# Patient Record
Sex: Male | Born: 1965 | Race: White | Hispanic: No | Marital: Single | Smoking: Never smoker
Health system: Southern US, Community
[De-identification: ages and names within clinical notes are randomized; demographics above are authoritative.]

## PROBLEM LIST (undated history)

## (undated) DIAGNOSIS — E119 Type 2 diabetes mellitus without complications: Secondary | ICD-10-CM

## (undated) DIAGNOSIS — M109 Gout, unspecified: Secondary | ICD-10-CM

## (undated) DIAGNOSIS — E1140 Type 2 diabetes mellitus with diabetic neuropathy, unspecified: Secondary | ICD-10-CM

## (undated) DIAGNOSIS — N289 Disorder of kidney and ureter, unspecified: Secondary | ICD-10-CM

## (undated) DIAGNOSIS — M545 Low back pain, unspecified: Secondary | ICD-10-CM

## (undated) DIAGNOSIS — I10 Essential (primary) hypertension: Secondary | ICD-10-CM

## (undated) DIAGNOSIS — G8929 Other chronic pain: Secondary | ICD-10-CM

## (undated) DIAGNOSIS — N189 Chronic kidney disease, unspecified: Secondary | ICD-10-CM

## (undated) HISTORY — PX: CHOLECYSTECTOMY: SHX55

---

## 2014-11-16 ENCOUNTER — Inpatient Hospital Stay
Admission: EM | Admit: 2014-11-16 | Discharge: 2014-11-18 | DRG: 380 | Disposition: A | Discharge status: Home / Self Care | Payer: No Typology Code available for payment source | Attending: Internal Medicine | Admitting: Internal Medicine

## 2014-11-16 ENCOUNTER — Emergency Department: Payer: No Typology Code available for payment source

## 2014-11-16 ENCOUNTER — Encounter: Payer: Self-pay | Admitting: Emergency Medicine

## 2014-11-16 DIAGNOSIS — I129 Hypertensive chronic kidney disease with stage 1 through stage 4 chronic kidney disease, or unspecified chronic kidney disease: Secondary | ICD-10-CM | POA: Diagnosis present

## 2014-11-16 DIAGNOSIS — E1140 Type 2 diabetes mellitus with diabetic neuropathy, unspecified: Secondary | ICD-10-CM | POA: Diagnosis present

## 2014-11-16 DIAGNOSIS — Z87442 Personal history of urinary calculi: Secondary | ICD-10-CM | POA: Diagnosis not present

## 2014-11-16 DIAGNOSIS — M1A9XX0 Chronic gout, unspecified, without tophus (tophi): Secondary | ICD-10-CM | POA: Diagnosis present

## 2014-11-16 DIAGNOSIS — Z794 Long term (current) use of insulin: Secondary | ICD-10-CM | POA: Diagnosis not present

## 2014-11-16 DIAGNOSIS — Z79899 Other long term (current) drug therapy: Secondary | ICD-10-CM | POA: Diagnosis not present

## 2014-11-16 DIAGNOSIS — Z8249 Family history of ischemic heart disease and other diseases of the circulatory system: Secondary | ICD-10-CM | POA: Diagnosis not present

## 2014-11-16 DIAGNOSIS — K529 Noninfective gastroenteritis and colitis, unspecified: Secondary | ICD-10-CM | POA: Diagnosis present

## 2014-11-16 DIAGNOSIS — G8929 Other chronic pain: Secondary | ICD-10-CM | POA: Diagnosis present

## 2014-11-16 DIAGNOSIS — N183 Chronic kidney disease, stage 3 (moderate): Secondary | ICD-10-CM | POA: Diagnosis present

## 2014-11-16 DIAGNOSIS — Z7982 Long term (current) use of aspirin: Secondary | ICD-10-CM | POA: Diagnosis not present

## 2014-11-16 DIAGNOSIS — N170 Acute kidney failure with tubular necrosis: Secondary | ICD-10-CM | POA: Diagnosis present

## 2014-11-16 DIAGNOSIS — R109 Unspecified abdominal pain: Secondary | ICD-10-CM

## 2014-11-16 DIAGNOSIS — E860 Dehydration: Secondary | ICD-10-CM | POA: Diagnosis present

## 2014-11-16 DIAGNOSIS — R110 Nausea: Secondary | ICD-10-CM

## 2014-11-16 DIAGNOSIS — K255 Chronic or unspecified gastric ulcer with perforation: Secondary | ICD-10-CM | POA: Insufficient documentation

## 2014-11-16 DIAGNOSIS — Z833 Family history of diabetes mellitus: Secondary | ICD-10-CM

## 2014-11-16 DIAGNOSIS — Z7984 Long term (current) use of oral hypoglycemic drugs: Secondary | ICD-10-CM | POA: Diagnosis not present

## 2014-11-16 DIAGNOSIS — Z791 Long term (current) use of non-steroidal anti-inflammatories (NSAID): Secondary | ICD-10-CM

## 2014-11-16 DIAGNOSIS — K251 Acute gastric ulcer with perforation: Secondary | ICD-10-CM | POA: Diagnosis not present

## 2014-11-16 DIAGNOSIS — E1122 Type 2 diabetes mellitus with diabetic chronic kidney disease: Secondary | ICD-10-CM | POA: Diagnosis present

## 2014-11-16 HISTORY — DX: Type 2 diabetes mellitus with diabetic neuropathy, unspecified: E11.40

## 2014-11-16 HISTORY — DX: Chronic kidney disease, unspecified: N18.9

## 2014-11-16 HISTORY — DX: Essential (primary) hypertension: I10

## 2014-11-16 HISTORY — DX: Low back pain: M54.5

## 2014-11-16 HISTORY — DX: Disorder of kidney and ureter, unspecified: N28.9

## 2014-11-16 HISTORY — DX: Other chronic pain: G89.29

## 2014-11-16 HISTORY — DX: Type 2 diabetes mellitus without complications: E11.9

## 2014-11-16 HISTORY — DX: Low back pain, unspecified: M54.50

## 2014-11-16 HISTORY — DX: Gout, unspecified: M10.9

## 2014-11-16 LAB — URINALYSIS COMPLETE WITH MICROSCOPIC (ARMC ONLY)
Bacteria, UA: NONE SEEN
Bilirubin Urine: NEGATIVE
GLUCOSE, UA: 50 mg/dL — AB
Ketones, ur: NEGATIVE mg/dL
Leukocytes, UA: NEGATIVE
Nitrite: NEGATIVE
Protein, ur: 30 mg/dL — AB
SPECIFIC GRAVITY, URINE: 1.017 (ref 1.005–1.030)
SQUAMOUS EPITHELIAL / LPF: NONE SEEN
pH: 5 (ref 5.0–8.0)

## 2014-11-16 LAB — COMPREHENSIVE METABOLIC PANEL
ALBUMIN: 4.4 g/dL (ref 3.5–5.0)
ALK PHOS: 111 U/L (ref 38–126)
ALT: 18 U/L (ref 17–63)
AST: 16 U/L (ref 15–41)
Anion gap: 11 (ref 5–15)
BUN: 66 mg/dL — ABNORMAL HIGH (ref 6–20)
CALCIUM: 9 mg/dL (ref 8.9–10.3)
CO2: 20 mmol/L — AB (ref 22–32)
CREATININE: 2.06 mg/dL — AB (ref 0.61–1.24)
Chloride: 101 mmol/L (ref 101–111)
GFR calc Af Amer: 42 mL/min — ABNORMAL LOW (ref >60)
GFR calc non Af Amer: 36 mL/min — ABNORMAL LOW (ref >60)
GLUCOSE: 314 mg/dL — AB (ref 65–99)
Potassium: 4.1 mmol/L (ref 3.5–5.1)
Sodium: 132 mmol/L — ABNORMAL LOW (ref 135–145)
Total Bilirubin: 0.3 mg/dL (ref 0.3–1.2)
Total Protein: 8.4 g/dL — ABNORMAL HIGH (ref 6.5–8.1)

## 2014-11-16 LAB — CBC
HCT: 52.3 % — ABNORMAL HIGH (ref 40.0–52.0)
HEMOGLOBIN: 17 g/dL (ref 13.0–18.0)
MCH: 29 pg (ref 26.0–34.0)
MCHC: 32.4 g/dL (ref 32.0–36.0)
MCV: 89.3 fL (ref 80.0–100.0)
PLATELETS: 221 10*3/uL (ref 150–440)
RBC: 5.86 MIL/uL (ref 4.40–5.90)
RDW: 14.3 % (ref 11.5–14.5)
WBC: 20 10*3/uL — ABNORMAL HIGH (ref 3.8–10.6)

## 2014-11-16 LAB — C DIFFICILE QUICK SCREEN W PCR REFLEX
C Diff antigen: NEGATIVE
C Diff interpretation: NEGATIVE
C Diff toxin: NEGATIVE

## 2014-11-16 LAB — DIFFERENTIAL
Basophils Absolute: 0.1 10*3/uL (ref 0–0.1)
Basophils Relative: 1 %
EOS ABS: 0.2 10*3/uL (ref 0–0.7)
EOS PCT: 1 %
Lymphocytes Relative: 13 %
Lymphs Abs: 2.6 10*3/uL (ref 1.0–3.6)
Monocytes Absolute: 1.5 10*3/uL — ABNORMAL HIGH (ref 0.2–1.0)
Monocytes Relative: 8 %
NEUTROS PCT: 77 %
Neutro Abs: 15.6 10*3/uL — ABNORMAL HIGH (ref 1.4–6.5)

## 2014-11-16 LAB — LIPASE, BLOOD: Lipase: 59 U/L — ABNORMAL HIGH (ref 11–51)

## 2014-11-16 LAB — GLUCOSE, CAPILLARY: Glucose-Capillary: 195 mg/dL — ABNORMAL HIGH (ref 65–99)

## 2014-11-16 MED ORDER — TRAMADOL HCL 50 MG PO TABS: 50.0000 mg | ORAL_TABLET | Freq: Every evening | ORAL | Status: DC | PRN

## 2014-11-16 MED ORDER — INSULIN ASPART PROT & ASPART (70-30 MIX) 100 UNIT/ML ~~LOC~~ SUSP
40.0000 [IU] | Freq: Two times a day (BID) | SUBCUTANEOUS | Status: DC
  Administered 2014-11-17 – 2014-11-18 (×3): 40 [IU] via SUBCUTANEOUS
  Filled 2014-11-16 (×3): qty 40

## 2014-11-16 MED ORDER — SODIUM CHLORIDE 0.9 % IV SOLN
1000.0000 mL | Freq: Once | INTRAVENOUS | Status: AC
  Administered 2014-11-16: 1000 mL via INTRAVENOUS

## 2014-11-16 MED ORDER — PANTOPRAZOLE SODIUM 40 MG IV SOLR
40.0000 mg | Freq: Two times a day (BID) | INTRAVENOUS | Status: DC
  Administered 2014-11-16 – 2014-11-18 (×4): 40 mg via INTRAVENOUS
  Filled 2014-11-16 (×4): qty 40

## 2014-11-16 MED ORDER — PIPERACILLIN-TAZOBACTAM 3.375 G IVPB
3.3750 g | Freq: Once | INTRAVENOUS | Status: AC
  Administered 2014-11-16: 3.375 g via INTRAVENOUS
  Filled 2014-11-16: qty 50

## 2014-11-16 MED ORDER — ONDANSETRON HCL 4 MG/2ML IJ SOLN
4.0000 mg | Freq: Four times a day (QID) | INTRAMUSCULAR | Status: DC | PRN
  Administered 2014-11-16: 4 mg via INTRAVENOUS
  Filled 2014-11-16: qty 2

## 2014-11-16 MED ORDER — VITAMIN B-12 1000 MCG PO TABS
2500.0000 ug | ORAL_TABLET | Freq: Every day | ORAL | Status: DC
  Administered 2014-11-17 – 2014-11-18 (×2): 2500 ug via ORAL
  Filled 2014-11-16 (×2): qty 3

## 2014-11-16 MED ORDER — ONDANSETRON HCL 4 MG PO TABS: 4.0000 mg | ORAL_TABLET | Freq: Four times a day (QID) | ORAL | Status: DC | PRN

## 2014-11-16 MED ORDER — SODIUM CHLORIDE 0.9 % IV SOLN
INTRAVENOUS | Status: DC
  Administered 2014-11-16 – 2014-11-17 (×3): via INTRAVENOUS

## 2014-11-16 MED ORDER — ALLOPURINOL 300 MG PO TABS
300.0000 mg | ORAL_TABLET | Freq: Every day | ORAL | Status: DC
  Administered 2014-11-16 – 2014-11-18 (×3): 300 mg via ORAL
  Filled 2014-11-16 (×3): qty 1

## 2014-11-16 MED ORDER — SODIUM CHLORIDE 0.9 % IJ SOLN
3.0000 mL | Freq: Two times a day (BID) | INTRAMUSCULAR | Status: DC
  Administered 2014-11-17: 3 mL via INTRAVENOUS

## 2014-11-16 MED ORDER — HYDROXYCHLOROQUINE SULFATE 200 MG PO TABS
400.0000 mg | ORAL_TABLET | Freq: Every day | ORAL | Status: DC
  Administered 2014-11-17 – 2014-11-18 (×2): 400 mg via ORAL
  Filled 2014-11-16 (×2): qty 2

## 2014-11-16 MED ORDER — SIMVASTATIN 20 MG PO TABS
20.0000 mg | ORAL_TABLET | Freq: Every day | ORAL | Status: DC
  Administered 2014-11-16 – 2014-11-17 (×2): 20 mg via ORAL
  Filled 2014-11-16 (×2): qty 1

## 2014-11-16 MED ORDER — NIACIN 500 MG PO TABS
250.0000 mg | ORAL_TABLET | Freq: Every day | ORAL | Status: DC
  Administered 2014-11-17 – 2014-11-18 (×2): 250 mg via ORAL
  Filled 2014-11-16 (×3): qty 1

## 2014-11-16 MED ORDER — FAMOTIDINE 20 MG PO TABS
20.0000 mg | ORAL_TABLET | Freq: Every day | ORAL | Status: DC
  Administered 2014-11-17 – 2014-11-18 (×2): 20 mg via ORAL
  Filled 2014-11-16 (×2): qty 1

## 2014-11-16 MED ORDER — ACETAMINOPHEN 650 MG RE SUPP
650.0000 mg | Freq: Four times a day (QID) | RECTAL | Status: DC | PRN
Start: 2014-11-16 — End: 2014-11-18

## 2014-11-16 MED ORDER — COLCHICINE 0.6 MG PO TABS: 0.6000 mg | ORAL_TABLET | Freq: Every day | ORAL | Status: DC | PRN

## 2014-11-16 MED ORDER — CITALOPRAM HYDROBROMIDE 20 MG PO TABS
40.0000 mg | ORAL_TABLET | Freq: Every day | ORAL | Status: DC
  Administered 2014-11-17 – 2014-11-18 (×2): 40 mg via ORAL
  Filled 2014-11-16 (×2): qty 2

## 2014-11-16 MED ORDER — MUPIROCIN 2 % EX OINT
1.0000 "application " | TOPICAL_OINTMENT | Freq: Every day | CUTANEOUS | Status: DC | PRN
  Filled 2014-11-16: qty 22

## 2014-11-16 MED ORDER — CHOLECALCIFEROL 10 MCG (400 UNIT) PO TABS
800.0000 [IU] | ORAL_TABLET | Freq: Every day | ORAL | Status: DC
  Administered 2014-11-17 – 2014-11-18 (×2): 800 [IU] via ORAL
  Filled 2014-11-16 (×2): qty 2

## 2014-11-16 MED ORDER — PIPERACILLIN-TAZOBACTAM 4.5 G IVPB
4.5000 g | Freq: Three times a day (TID) | INTRAVENOUS | Status: DC
  Administered 2014-11-17 – 2014-11-18 (×4): 4.5 g via INTRAVENOUS
  Filled 2014-11-16 (×6): qty 100

## 2014-11-16 MED ORDER — AMLODIPINE BESYLATE 10 MG PO TABS
10.0000 mg | ORAL_TABLET | Freq: Every day | ORAL | Status: DC
  Administered 2014-11-17 – 2014-11-18 (×2): 10 mg via ORAL
  Filled 2014-11-16 (×2): qty 1

## 2014-11-16 MED ORDER — CLONAZEPAM 1 MG PO TABS
1.0000 mg | ORAL_TABLET | Freq: Two times a day (BID) | ORAL | Status: DC | PRN
  Administered 2014-11-17: 1 mg via ORAL

## 2014-11-16 MED ORDER — ACETAMINOPHEN 325 MG PO TABS: 650.0000 mg | ORAL_TABLET | Freq: Four times a day (QID) | ORAL | Status: DC | PRN

## 2014-11-16 MED ORDER — ONDANSETRON HCL 4 MG/2ML IJ SOLN
4.0000 mg | Freq: Once | INTRAMUSCULAR | Status: AC
  Administered 2014-11-16: 4 mg via INTRAVENOUS
  Filled 2014-11-16: qty 2

## 2014-11-16 MED ORDER — FERROUS SULFATE 325 (65 FE) MG PO TABS
325.0000 mg | ORAL_TABLET | Freq: Every day | ORAL | Status: DC
  Administered 2014-11-17 – 2014-11-18 (×2): 325 mg via ORAL
  Filled 2014-11-16 (×2): qty 1

## 2014-11-16 MED ORDER — INSULIN ASPART 100 UNIT/ML ~~LOC~~ SOLN: 0.0000 [IU] | Freq: Every day | SUBCUTANEOUS | Status: DC

## 2014-11-16 MED ORDER — CLONAZEPAM 1 MG PO TABS
2.0000 mg | ORAL_TABLET | Freq: Every day | ORAL | Status: DC
  Administered 2014-11-16: 2 mg via ORAL
  Filled 2014-11-16 (×2): qty 2

## 2014-11-16 MED ORDER — INSULIN ASPART 100 UNIT/ML ~~LOC~~ SOLN
0.0000 [IU] | Freq: Three times a day (TID) | SUBCUTANEOUS | Status: DC
  Administered 2014-11-17 (×2): 2 [IU] via SUBCUTANEOUS
  Administered 2014-11-17 – 2014-11-18 (×2): 3 [IU] via SUBCUTANEOUS
  Filled 2014-11-16 (×2): qty 3
  Filled 2014-11-16 (×2): qty 2

## 2014-11-16 NOTE — Progress Notes (Signed)
ANTIBIOTIC CONSULT NOTE - INITIAL  Pharmacy Consult for Zosyn  Indication: intra-abdominal  No Known Allergies  Patient Measurements: Height: 6\' 1"  (185.4 cm) Weight: 275 lb (124.739 kg) IBW/kg (Calculated) : 79.9 Adjusted Body Weight:   Vital Signs: Temp: 98.5 F (36.9 C) (11/11 1745) Temp Source: Oral (11/11 1745) BP: 131/85 mmHg (11/11 1745) Pulse Rate: 102 (11/11 1745) Intake/Output from previous day:   Intake/Output from this shift:    Labs:  Recent Labs  11/16/14 1010  WBC 20.0*  HGB 17.0  PLT 221  CREATININE 2.06*   Estimated Creatinine Clearance: 59.3 mL/min (by C-G formula based on Cr of 2.06). No results for input(s): VANCOTROUGH, VANCOPEAK, VANCORANDOM, GENTTROUGH, GENTPEAK, GENTRANDOM, TOBRATROUGH, TOBRAPEAK, TOBRARND, AMIKACINPEAK, AMIKACINTROU, AMIKACIN in the last 72 hours.   Microbiology: No results found for this or any previous visit (from the past 720 hour(s)).  Medical History: Past Medical History  Diagnosis Date  . Renal disorder     kidney stones  . Diabetes mellitus without complication (HCC)   . Hypertension   . Gout   . Chronic lower back pain   . CKD (chronic kidney disease)   . Diabetic neuropathy (HCC)     Medications:  Prescriptions prior to admission  Medication Sig Dispense Refill Last Dose  . allopurinol (ZYLOPRIM) 300 MG tablet Take 300-600 mg by mouth See admin instructions. 2 tablets every morning and 1 tablet at bedtime   11/15/2014 at pm  . amLODipine (NORVASC) 10 MG tablet Take 10 mg by mouth daily.   11/15/2014 at am  . aspirin 325 MG tablet Take 325 mg by mouth daily.   11/15/2014 at am  . citalopram (CELEXA) 40 MG tablet Take 40 mg by mouth daily.   11/15/2014 at am  . clonazePAM (KLONOPIN) 2 MG tablet Take 2 mg by mouth at bedtime.   11/15/2014 at pm  . clonazePAM (KLONOPIN) 2 MG tablet Take 1 mg by mouth daily as needed for anxiety.   PRN  . colchicine 0.6 MG tablet Take 0.6 mg by mouth daily as needed (for gout  flare-ups).   PRN  . Cyanocobalamin (HM SUPER VITAMIN B12) 2500 MCG CHEW Chew 2,500 mcg by mouth daily.   11/15/2014 at am  . ferrous sulfate 325 (65 FE) MG tablet Take 325 mg by mouth daily.   11/15/2014 at am  . hydroxychloroquine (PLAQUENIL) 200 MG tablet Take 400 mg by mouth daily.   11/15/2014 at am  . indomethacin (INDOCIN) 50 MG capsule Take 50 mg by mouth daily.   11/15/2014 at am  . insulin lispro (HUMALOG) 100 UNIT/ML injection Inject into the skin 3 (three) times daily as needed for high blood sugar (per sliding scale).   PRN  . insulin lispro protamine-lispro (HUMALOG 75/25 MIX) (75-25) 100 UNIT/ML SUSP injection Inject 60-80 Units into the skin See admin instructions. 80 units every morning and 60 units at bedtime   11/15/2014 at pm  . lisinopril (PRINIVIL,ZESTRIL) 40 MG tablet Take 40 mg by mouth daily.   11/15/2014 at am  . metFORMIN (GLUCOPHAGE) 500 MG tablet Take 500 mg by mouth 2 (two) times daily with a meal.   11/15/2014 at pm  . mupirocin ointment (BACTROBAN) 2 % Place 1 application into the nose daily as needed (for MRSA flare-ups).   PRN  . niacin 250 MG tablet Take 250 mg by mouth daily.   11/15/2014 at am  . ranitidine (ZANTAC) 150 MG tablet Take 150 mg by mouth daily.   11/15/2014 at  am  . simvastatin (ZOCOR) 20 MG tablet Take 20 mg by mouth at bedtime.   11/15/2014 at pm  . traMADol (ULTRAM) 50 MG tablet Take 50-100 mg by mouth at bedtime as needed for moderate pain or severe pain.   PRN  . Vitamin D, Cholecalciferol, 400 UNITS CHEW Chew 800 Units by mouth daily.   11/15/2014 at am   Assessment: CrCl = 59.3 ml/min, TBW = 124.7 kg  Goal of Therapy:  resolution of infection  Plan:  Expected duration 7 days with resolution of temperature and/or normalization of WBC   Zosyn 3.375 gm IV X 1 given on 11/11 @ 16:00. Zosyn 4.5 gm IV Q8H EI ordered to start 11/12 @ 2:00.   Mario Lewis D 11/16/2014,8:28 PM

## 2014-11-16 NOTE — Consult Note (Signed)
General Surgery Consult Note  CC: Nausea/vomiting/diarrhea x 1 week, leukocytosis, CT scan with small perigastric pneumoperitoneum pockets  HPI: [name] is a pleasant 49 yo with a history of DM and gout who presents with 1 week of nausea/vomiting/diarrhea.  Began acutely.  Has not been resolving.  + chills.  Was doing well before this.  No abdominal pain then or currently.  Takes indomethacin for gout.  H/o EGD and colonoscopy for anemia in past which were unremarkable.  No fevers, chest pain, shortness of breath, cough, dysuria/hematuria.  Baseline cr per his report is around 2, bg usually in 200s but increases following perispinal steroid ingestion, which he has gotten recently.   Active Ambulatory Problems    Diagnosis Date Noted  . No Active Ambulatory Problems   Resolved Ambulatory Problems    Diagnosis Date Noted  . No Resolved Ambulatory Problems   Past Medical History  Diagnosis Date  . Renal disorder   . Diabetes mellitus without complication (HCC)   . Hypertension   . Gout   . Chronic lower back pain   . CKD (chronic kidney disease)   . Diabetic neuropathy [facility](HCC)    Past Surgical History  Procedure Laterality Date  . Cholecystectomy       Medication List    ASK your doctor about these medications        allopurinol 300 MG tablet  Commonly known as:  ZYLOPRIM  Take 300-600 mg by mouth See admin instructions. 2 tablets every morning and 1 tablet at bedtime     amLODipine 10 MG tablet  Commonly known as:  NORVASC  Take 10 mg by mouth daily.     aspirin 325 MG tablet  Take 325 mg by mouth daily.     citalopram 40 MG tablet  Commonly known as:  CELEXA  Take 40 mg by mouth daily.     clonazePAM 2 MG tablet  Commonly known as:  KLONOPIN  Take 2 mg by mouth at bedtime.     clonazePAM 2 MG tablet  Commonly known as:  KLONOPIN  Take 1 mg by mouth daily as needed for anxiety.     colchicine 0.6 MG tablet  Take 0.6 mg by mouth daily as needed (for gout  flare-ups).     ferrous sulfate 325 (65 FE) MG tablet  Take 325 mg by mouth daily.     HM SUPER VITAMIN B12 2500 MCG Chew  Generic drug:  Cyanocobalamin  Chew 2,500 mcg by mouth daily.     hydroxychloroquine 200 MG tablet  Commonly known as:  PLAQUENIL  Take 400 mg by mouth daily.     indomethacin 50 MG capsule  Commonly known as:  INDOCIN  Take 50 mg by mouth daily.     insulin lispro 100 UNIT/ML injection  Commonly known as:  HUMALOG  Inject into the skin 3 (three) times daily as needed for high blood sugar (per sliding scale).     insulin lispro protamine-lispro (75-25) 100 UNIT/ML Susp injection  Commonly known as:  HUMALOG 75/25 MIX  Inject 60-80 Units into the skin See admin instructions. 80 units every morning and 60 units at bedtime     lisinopril 40 MG tablet  Commonly known as:  PRINIVIL,ZESTRIL  Take 40 mg by mouth daily.     metFORMIN 500 MG tablet  Commonly known as:  GLUCOPHAGE  Take 500 mg by mouth 2 (two) times daily with a meal.     mupirocin ointment 2 %  Commonly known as:  BACTROBAN  Place 1 application into the nose daily as needed (for MRSA flare-ups).     niacin 250 MG tablet  Take 250 mg by mouth daily.     ranitidine 150 MG tablet  Commonly known as:  ZANTAC  Take 150 mg by mouth daily.     simvastatin 20 MG tablet  Commonly known as:  ZOCOR  Take 20 mg by mouth at bedtime.     traMADol 50 MG tablet  Commonly known as:  ULTRAM  Take 50-100 mg by mouth at bedtime as needed for moderate pain or severe pain.     Vitamin D (Cholecalciferol) 400 UNITS Chew  Chew 800 Units by mouth daily.      No Known Allergies   Social History   Social History  . Marital Status: Single    Spouse Name: N/A  . Number of Children: N/A  . Years of Education: N/A   Occupational History  . Not on file.   Social History Main Topics  . Smoking status: Never Smoker   . Smokeless tobacco: Not on file  . Alcohol Use: No  . Drug Use: No  . Sexual  Activity: Not on file   Other Topics Concern  . Not on file   Social History Narrative   Lives at home with his wife   Family History  Problem Relation Age of Onset  . CAD Father   . CAD Mother   . Diabetes Mellitus II Father    Blood pressure 116/72, pulse 110, temperature 98.5 F (36.9 C), temperature source Oral, height  (1.854 m), weight 275 lb (124.739 kg), SpO2 96 %. GEN: NAD/A&Ox3 FACE: no obvious facial trauma, normal external nose, normal external ears EYES: no scleral icterus, no conjunctivitis HEAD: normocephalic atraumatic CV: RRR, no MRG RESP: moving air well, lungs clear ABD: soft, nontender, nondistended BACK: No erythema/induration/fluctaunce,  nontender EXT: moving all ext well, strength 5/5 NEURO: cnII-XII grossly intact, sensation intact all 4 ext  Labs: Personally reviewed, significant for: -WBC 20 BUN 66 BG 314  Imaging: Personally reviewed, significant for  - tiny foci of intraluminal air around stomach, stomach otherwise unremarkable, no intraabdominal inflammation associated  A/P 49 yo M with 1 week of n/v/d and incidental finding of small foci of intraluminal air.  Etiology uncertain, would suspect ulcer perf, particularly in context of prior chronic NSAID use but has had no prior or current pain whatsoever.  Tachycardia and leukocytosis significant but BUN elevated as well so may in part be improved with dehydration.  Would recommend treating as perf with abx and GI input.

## 2014-11-16 NOTE — ED Notes (Signed)
Pt reports nausea, reports "kidney pain", reports lower back pain and lower abdominal pain. Pt reports hx of kidney stones.

## 2014-11-16 NOTE — ED Provider Notes (Signed)
[facility] Emergency Department Provider Note  ____________________________________________  Time seen: On arrival  I have reviewed the triage vital signs and the nursing notes.   HISTORY  Chief Complaint Nausea; Abdominal Pain; and Back Pain    HPI Mario Lewis is a 49 y.o. male who presents with complaints of nausea vomiting diarrhea, queasiness for the last several days. He is most concerned about his nausea and a sense of feeling queasy. He has diffuse abdominal discomfort and some pain in his flanks bilaterally which she believes demonstrates that he is having kidney stones. He denies blood in his urine. No dysuria. No fevers or chills. He has had 2 episodes of loose stools     Past Medical History  Diagnosis Date  . Renal disorder     kidney stones  . Diabetes mellitus without complication (HCC)   . Hypertension   . Gout   . Chronic lower back pain     There are no active problems to display for this patient.   Past Surgical History  Procedure Laterality Date  . Cholecystectomy      Current Outpatient Rx  Name  Route  Sig  Dispense  Refill  . allopurinol (ZYLOPRIM) 300 MG tablet   Oral   Take 300-600 mg by mouth See admin instructions. 2 tablets every morning and 1 tablet at bedtime         . amLODipine (NORVASC) 10 MG tablet   Oral   Take 10 mg by mouth daily.         Marland Kitchen aspirin 325 MG tablet   Oral   Take 325 mg by mouth daily.         . citalopram (CELEXA) 40 MG tablet   Oral   Take 40 mg by mouth daily.         . clonazePAM (KLONOPIN) 2 MG tablet   Oral   Take 2 mg by mouth at bedtime.         . clonazePAM (KLONOPIN) 2 MG tablet   Oral   Take 1 mg by mouth daily as needed for anxiety.         . colchicine 0.6 MG tablet   Oral   Take 0.6 mg by mouth daily as needed (for gout flare-ups).         . Cyanocobalamin (HM SUPER VITAMIN B12) 2500 MCG CHEW   Oral   Chew 2,500 mcg by mouth daily.          . ferrous sulfate 325 (65 FE) MG tablet   Oral   Take 325 mg by mouth daily.         . hydroxychloroquine (PLAQUENIL) 200 MG tablet   Oral   Take 400 mg by mouth daily.         . indomethacin (INDOCIN) 50 MG capsule   Oral   Take 50 mg by mouth daily.         . insulin lispro (HUMALOG) 100 UNIT/ML injection   Subcutaneous   Inject into the skin 3 (three) times daily as needed for high blood sugar (per sliding scale).         . insulin lispro protamine-lispro (HUMALOG 75/25 MIX) (75-25) 100 UNIT/ML SUSP injection   Subcutaneous   Inject 60-80 Units into the skin See admin instructions. 80 units every morning and 60 units at bedtime         . lisinopril (PRINIVIL,ZESTRIL) 40 MG tablet   Oral   Take 40 mg  by mouth daily.         . metFORMIN (GLUCOPHAGE) 500 MG tablet   Oral   Take 500 mg by mouth 2 (two) times daily with a meal.         . mupirocin ointment (BACTROBAN) 2 %   Nasal   Place 1 application into the nose daily as needed (for MRSA flare-ups).         . niacin 250 MG tablet   Oral   Take 250 mg by mouth daily.         . ranitidine (ZANTAC) 150 MG tablet   Oral   Take 150 mg by mouth daily.         . simvastatin (ZOCOR) 20 MG tablet   Oral   Take 20 mg by mouth at bedtime.         . traMADol (ULTRAM) 50 MG tablet   Oral   Take 50-100 mg by mouth at bedtime as needed for moderate pain or severe pain.         . Vitamin D, Cholecalciferol, 400 UNITS CHEW   Oral   Chew 800 Units by mouth daily.           Allergies Review of patient's allergies indicates no known allergies.  No family history on file.  Social History Social History  Substance Use Topics  . Smoking status: Never Smoker   . Smokeless tobacco: None  . Alcohol Use: No    Review of Systems  Constitutional: Negative for fever. Eyes: Negative for visual changes. ENT: Negative for sore throat Cardiovascular: Negative for chest pain. Respiratory: Negative  for shortness of breath. Gastrointestinal: Positive for nausea and diarrhea, diffuse abdominal discomfort Genitourinary: Negative for dysuria. Musculoskeletal: Negative for back pain. Skin: Negative for rash. Neurological: Negative for headaches or focal weakness Psychiatric: No anxiety    ____________________________________________   PHYSICAL EXAM:  VITAL SIGNS: ED Triage Vitals  Enc Vitals Group     BP 11/16/14 1130 126/97 mmHg     Pulse Rate 11/16/14 1130 104     Resp --      Temp 11/16/14 0957 98.5 F (36.9 C)     Temp Source 11/16/14 0957 Oral     SpO2 11/16/14 1130 99 %     Weight 11/16/14 0957 275 lb (124.739 kg)     Height 11/16/14 0957 6\' 1"  (1.854 m)     Head Cir --      Peak Flow --      Pain Score 11/16/14 0957 4     Pain Loc --      Pain Edu? --      Excl. in GC? --    Constitutional: Alert and oriented. Well appearing and in no distress. Eyes: Conjunctivae are normal.  ENT   Head: Normocephalic and atraumatic.   Mouth/Throat: Mucous membranes are moist. Cardiovascular: Normal rate, regular rhythm. Normal and symmetric distal pulses are present in all extremities. No murmurs, rubs, or gallops. Respiratory: Normal respiratory effort without tachypnea nor retractions. Breath sounds are clear and equal bilaterally.  Gastrointestinal: Soft and non-tender in all quadrants. Nonsurgical abdomen. No tenderness over the epigastrium. No distention. There is no CVA tenderness. Genitourinary: deferred Musculoskeletal: Nontender with normal range of motion in all extremities. No lower extremity tenderness nor edema. Neurologic:  Normal speech and language. No gross focal neurologic deficits are appreciated. Skin:  Skin is warm, dry and intact. No rash noted. Psychiatric: Mood and affect are normal. Patient exhibits appropriate insight  and judgment.  ____________________________________________    LABS (pertinent positives/negatives)  Labs Reviewed  LIPASE,  BLOOD - Abnormal; Notable for the following:    Lipase 59 (*)    All other components within normal limits  COMPREHENSIVE METABOLIC PANEL - Abnormal; Notable for the following:    Sodium 132 (*)    CO2 20 (*)    Glucose, Bld 314 (*)    BUN 66 (*)    Creatinine, Ser 2.06 (*)    Total Protein 8.4 (*)    GFR calc non Af Amer 36 (*)    GFR calc Af Amer 42 (*)    All other components within normal limits  CBC - Abnormal; Notable for the following:    WBC 20.0 (*)    HCT 52.3 (*)    All other components within normal limits  URINALYSIS COMPLETEWITH MICROSCOPIC ([facility] ONLY)    ____________________________________________   EKG  None  ____________________________________________    RADIOLOGY I have personally reviewed any xrays that were ordered on this patient: CT abdomen and pelvis shows extra luminal air surrounding stomach  ____________________________________________   PROCEDURES  Procedure(s) performed: none  Critical Care performed: yes  CRITICAL CARE Performed by: Jene Every   Total critical care time:30 minutes  Critical care time was exclusive of separately billable procedures and treating other patients.  Critical care was necessary to treat or prevent imminent or life-threatening deterioration.  Critical care was time spent personally by me on the following activities: development of treatment plan with patient and/or surrogate as well as nursing, discussions with consultants, evaluation of patient's response to treatment, examination of patient, obtaining history from patient or surrogate, ordering and performing treatments and interventions, ordering and review of laboratory studies, ordering and review of radiographic studies, pulse oximetry and re-evaluation of patient's condition.   ____________________________________________   INITIAL IMPRESSION / ASSESSMENT AND PLAN / ED COURSE  Pertinent labs & imaging results that were available during my  care of the patient were reviewed by me and considered in my medical decision making (see chart for details).  Patient's initial presentation most consistent with viral gastroenteritis given diarrhea and nausea however he does have a history of kidney stones and he is convinced that this is the cause of his discomfort. We will obtain labs and if abnormal we will obtain CT renal stone study   CT demonstrates extra luminal air surrounding stomach. I have consulted surgery and order blood cultures and Zosyn IV  Dr. Sharmon Revere has seen the patient in the emergency department, he does not think there is any need for surgery at this time and recommends admission to medicine and he will follow the patient.  Discussed the case with Dr. Nemiah Commander of medicine who will admit the patient  ____________________________________________   FINAL CLINICAL IMPRESSION(S) / ED DIAGNOSES  Final diagnoses:  Flank pain  Perforated gastric ulcer, acute (HCC)     Jene Every, MD 11/16/14 1513

## 2014-11-16 NOTE — H&P (Addendum)
[facility] - [place] at [facility]   PATIENT NAME: Mario Lewis    MR#:  [id number]  DATE OF BIRTH:  12-27-1965  DATE OF ADMISSION:  11/16/2014  PRIMARY CARE PHYSICIAN: Rolm Gala, MD   REQUESTING/REFERRING PHYSICIAN: Dr. Jene Every  CHIEF COMPLAINT:   Chief Complaint  Patient presents with  . Nausea  . Abdominal Pain  . Back Pain    HISTORY OF PRESENT ILLNESS:  Mario Lewis  is a 49 y.o. male with a known history of retention, insulin-dependent diabetes mellitus CK D with baseline creatinine around 1.4, gout on indomethacin chronically presents to the hospital secondary to nausea vomiting and diarrhea. Patient states he gets epidural steroid shot often for his low back pain. He had a shot last Friday, the following day he developed abdominal cramping and also diarrhea. He was having 4-5 loose stools every day. Couple of days after his diarrhea he started having nausea vomiting, intense belching now that haven't been improving. He works at [employer] in the [employer]. Denies any fevers, but has chills. 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 Diarrhea has improved to 2-3 times a day at this time but nausea and vomiting and abdominal pain have been worse. His abdominal pain is mostly cramping in his lower abdomen. Etiology of the abdomen showing tiny foci of extraluminal or air cells suggesting gastric perforation however patient does not have any peritoneal signs and also seen by surgeon. he is being admitted for acute gastroenteritis and also acute on chronic renal failure.  PAST MEDICAL HISTORY:   Past Medical History  Diagnosis Date  . Renal disorder     kidney stones  . Diabetes mellitus without complication (HCC)   . Hypertension   . Gout   . Chronic lower back pain   . CKD (chronic kidney disease)   . Diabetic neuropathy (HCC)     PAST SURGICAL HISTORY:   Past Surgical History  Procedure  Laterality Date  . Cholecystectomy      SOCIAL HISTORY:   Social History  Substance Use Topics  . Smoking status: Never Smoker   . Smokeless tobacco: Not on file  . Alcohol Use: No    FAMILY HISTORY:   Family History  Problem Relation Age of Onset  . CAD Father   . CAD Mother   . Diabetes Mellitus II Father     DRUG ALLERGIES:  No Known Allergies  REVIEW OF SYSTEMS:   Review of Systems  Constitutional: Negative for fever, chills, weight loss and malaise/fatigue.  HENT: Negative for ear discharge, ear pain, nosebleeds and tinnitus.   Eyes: Negative for blurred vision, double vision and photophobia.  Respiratory: Negative for cough, hemoptysis, shortness of breath and wheezing.   Cardiovascular: Negative for chest pain, palpitations, orthopnea and leg swelling.  Gastrointestinal: Positive for nausea, vomiting, abdominal pain and diarrhea. Negative for heartburn, constipation and melena.  Genitourinary: Negative for dysuria, urgency, frequency and hematuria.  Musculoskeletal: Negative for myalgias, back pain and neck pain.  Skin: Negative for rash.  Neurological: Positive for weakness. Negative for dizziness, tingling, tremors, sensory change, speech change, focal weakness and headaches.  Endo/Heme/Allergies: Does not bruise/bleed easily.  Psychiatric/Behavioral: Negative for depression.    MEDICATIONS AT HOME:   Prior to Admission medications   Medication Sig Start Date End Date Taking? Authorizing Provider  allopurinol (ZYLOPRIM) 300 MG tablet Take 300-600 mg by mouth See admin instructions. 2 tablets every morning and 1 tablet at  bedtime   Yes Historical Provider, MD  amLODipine (NORVASC) 10 MG tablet Take 10 mg by mouth daily.   Yes Historical Provider, MD  aspirin 325 MG tablet Take 325 mg by mouth daily.   Yes Historical Provider, MD  citalopram (CELEXA) 40 MG tablet Take 40 mg by mouth daily.   Yes Historical Provider, MD  clonazePAM (KLONOPIN) 2 MG tablet Take 2  mg by mouth at bedtime.   Yes Historical Provider, MD  clonazePAM (KLONOPIN) 2 MG tablet Take 1 mg by mouth daily as needed for anxiety.   Yes Historical Provider, MD  colchicine 0.6 MG tablet Take 0.6 mg by mouth daily as needed (for gout flare-ups).   Yes Historical Provider, MD  Cyanocobalamin (HM SUPER VITAMIN B12) 2500 MCG CHEW Chew 2,500 mcg by mouth daily.   Yes Historical Provider, MD  ferrous sulfate 325 (65 FE) MG tablet Take 325 mg by mouth daily.   Yes Historical Provider, MD  hydroxychloroquine (PLAQUENIL) 200 MG tablet Take 400 mg by mouth daily.   Yes Historical Provider, MD  indomethacin (INDOCIN) 50 MG capsule Take 50 mg by mouth daily.   Yes Historical Provider, MD  insulin lispro (HUMALOG) 100 UNIT/ML injection Inject into the skin 3 (three) times daily as needed for high blood sugar (per sliding scale).   Yes Historical Provider, MD  insulin lispro protamine-lispro (HUMALOG 75/25 MIX) (75-25) 100 UNIT/ML SUSP injection Inject 60-80 Units into the skin See admin instructions. 80 units every morning and 60 units at bedtime   Yes Historical Provider, MD  lisinopril (PRINIVIL,ZESTRIL) 40 MG tablet Take 40 mg by mouth daily.   Yes Historical Provider, MD  metFORMIN (GLUCOPHAGE) 500 MG tablet Take 500 mg by mouth 2 (two) times daily with a meal.   Yes Historical Provider, MD  mupirocin ointment (BACTROBAN) 2 % Place 1 application into the nose daily as needed (for MRSA flare-ups).   Yes Historical Provider, MD  niacin 250 MG tablet Take 250 mg by mouth daily.   Yes Historical Provider, MD  ranitidine (ZANTAC) 150 MG tablet Take 150 mg by mouth daily.   Yes Historical Provider, MD  simvastatin (ZOCOR) 20 MG tablet Take 20 mg by mouth at bedtime.   Yes Historical Provider, MD  traMADol (ULTRAM) 50 MG tablet Take 50-100 mg by mouth at bedtime as needed for moderate pain or severe pain.   Yes Historical Provider, MD  Vitamin D, Cholecalciferol, 400 UNITS CHEW Chew 800 Units by mouth daily.    Yes Historical Provider, MD      VITAL SIGNS:  Blood pressure 116/72, pulse 110, temperature 98.5 F (36.9 C), temperature source Oral, height 6\' 1"  (1.854 m), weight 124.739 kg (275 lb), SpO2 96 %.  PHYSICAL EXAMINATION:   Physical Exam  GENERAL:  49 y.o.-year-old obese patient lying in the bed with no acute distress.  EYES: Pupils equal, round, reactive to light and accommodation. No scleral icterus. Extraocular muscles intact.  HEENT: Head atraumatic, normocephalic. Oropharynx and nasopharynx clear.  NECK:  Supple, no jugular venous distention. No thyroid enlargement, no tenderness.  LUNGS: Normal breath sounds bilaterally, no wheezing, rales,rhonchi or crepitation. No use of accessory muscles of respiration.  CARDIOVASCULAR: S1, S2 normal. No murmurs, rubs, or gallops.  ABDOMEN: Soft, discomfort in lower abdomen, no guarding or rigidity. Normal bowel sounds. Constant belching on exam, distended abdomen. Bowel sounds present. No organomegaly or mass.  EXTREMITIES: No pedal edema, cyanosis, or clubbing.  NEUROLOGIC: Cranial nerves II through XII are intact.  Muscle strength 5/5 in all extremities. Sensation intact. Gait not checked.  PSYCHIATRIC: The patient is alert and oriented x 3.  SKIN: No obvious rash, lesion, or ulcer.   LABORATORY PANEL:   CBC  Recent Labs Lab 11/16/14 1010  WBC 20.0*  HGB 17.0  HCT 52.3*  PLT 221   ------------------------------------------------------------------------------------------------------------------  Chemistries   Recent Labs Lab 11/16/14 1010  NA 132*  K 4.1  CL 101  CO2 20*  GLUCOSE 314*  Lewis 66*  CREATININE 2.06*  CALCIUM 9.0  AST 16  ALT 18  ALKPHOS 111  BILITOT 0.3   ------------------------------------------------------------------------------------------------------------------  Cardiac Enzymes No results for input(s): TROPONINI in the last 168  hours. ------------------------------------------------------------------------------------------------------------------  RADIOLOGY:  Ct Renal Stone Study  11/16/2014  CLINICAL DATA:  Nausea and abdominal pain for 2 days EXAM: CT ABDOMEN AND PELVIS WITHOUT CONTRAST TECHNIQUE: Multidetector CT imaging of the abdomen and pelvis was performed following the standard protocol without IV contrast. COMPARISON:  None. FINDINGS: Lung bases are free of acute infiltrate or sizable effusion. Gallbladder has been surgically removed. The liver, spleen, pancreas and adrenal glands are within normal limits. The kidneys are well visualized bilaterally. No renal calculi or obstructive changes are seen. The ureters are within normal limits bilaterally. The bladder is decompressed. Fluid is noted throughout the colon without obstructive change. The appendix is within normal limits. No significant diverticular change is noted. No acute bony abnormality is seen. Stomach is decompressed. A sh feet use small foci of extraluminal air are noted surrounding the stomach. No focal defect is noted although the possibility of a small perforation from the stomach would deserve consideration. IMPRESSION: Tiny foci of extraluminal air surrounding the stomach. These changes are suggestive of a small gastric perforation. Further workup is recommended. No other focal abnormality is noted. Electronically Signed   By: Alcide Clever M.D.   On: 11/16/2014 13:25    EKG:  No orders found for this or any previous visit.  IMPRESSION AND PLAN:   Mario Lewis  is a 49 y.o. male with a known history of hypertension, insulin-dependent diabetes mellitus CK D with baseline creatinine around 1.4, gout on indomethacin chronically presents to the hospital secondary to nausea vomiting and diarrhea.  #1 acute gastroenteritis-started on clear liquid diet, IV fluids -GI consult. Stool studies ordered. Not starting on any empiric antibiotics at this  time.  #2 gastritis with possible perforation-no signs of peritonitis. Likely has bad gastritis. -Hold indomethacin. Appreciate surgical consult. -We will get GI consult. -Start Protonix IV twice a day. Continue his ranitidine  #3 acute on chronic kidney disease-likely ATN and prerenal from dehydration- -Gentle IV hydration. Hold nephrotoxins and monitor.  #4 hypertension-continue Norvasc. Lisinopril is on hold due to renal failure  #5 insulin-dependent diabetes mellitus-since patient is only on clear liquid diet, decrease his insulin dose and continue twice a day. -Continue sliding scale insulin -Hold metformin due to renal failure  #6 DVT prophylaxis-possible gastritis, will hold off on anticoagulation. -Do Ted's and SCDs  #7 gout-continue colchicine, hold indomethacin. -Decrease allopurinol dose   All the records are reviewed and case discussed with ED provider. Management plans discussed with the patient, family and they are in agreement.  CODE STATUS: Full code  TOTAL TIME TAKING CARE OF THIS PATIENT: 50 minutes.    Enid Baas M.D on 11/16/2014 at 4:21 PM  Between 7am to 6pm - Pager - [phone]  After 6pm go to [email or web address] - password [email or web address]  Office  [phone]  CC: Primary care physician; Rolm Gala, MD

## 2014-11-17 ENCOUNTER — Encounter: Payer: Self-pay | Admitting: Gastroenterology

## 2014-11-17 DIAGNOSIS — K529 Noninfective gastroenteritis and colitis, unspecified: Secondary | ICD-10-CM

## 2014-11-17 DIAGNOSIS — K251 Acute gastric ulcer with perforation: Principal | ICD-10-CM

## 2014-11-17 DIAGNOSIS — K255 Chronic or unspecified gastric ulcer with perforation: Secondary | ICD-10-CM | POA: Insufficient documentation

## 2014-11-17 LAB — GLUCOSE, CAPILLARY
GLUCOSE-CAPILLARY: 205 mg/dL — AB (ref 65–99)
GLUCOSE-CAPILLARY: 142 mg/dL — AB (ref 65–99)
GLUCOSE-CAPILLARY: 170 mg/dL — AB (ref 65–99)
GLUCOSE-CAPILLARY: 159 mg/dL — AB (ref 65–99)

## 2014-11-17 LAB — CBC
HCT: 43.8 % (ref 40.0–52.0)
HEMOGLOBIN: 14.7 g/dL (ref 13.0–18.0)
MCH: 30.1 pg (ref 26.0–34.0)
MCHC: 33.7 g/dL (ref 32.0–36.0)
MCV: 89.4 fL (ref 80.0–100.0)
PLATELETS: 149 10*3/uL — AB (ref 150–440)
RBC: 4.9 MIL/uL (ref 4.40–5.90)
RDW: 14.6 % — ABNORMAL HIGH (ref 11.5–14.5)
WBC: 10.6 10*3/uL (ref 3.8–10.6)

## 2014-11-17 LAB — BASIC METABOLIC PANEL
ANION GAP: 5 (ref 5–15)
BUN: 44 mg/dL — ABNORMAL HIGH (ref 6–20)
CALCIUM: 7.9 mg/dL — AB (ref 8.9–10.3)
CO2: 22 mmol/L (ref 22–32)
CREATININE: 1.55 mg/dL — AB (ref 0.61–1.24)
Chloride: 108 mmol/L (ref 101–111)
GFR, EST AFRICAN AMERICAN: 59 mL/min — AB (ref >60)
GFR, EST NON AFRICAN AMERICAN: 51 mL/min — AB (ref >60)
Glucose, Bld: 211 mg/dL — ABNORMAL HIGH (ref 65–99)
Potassium: 4.9 mmol/L (ref 3.5–5.1)
SODIUM: 135 mmol/L (ref 135–145)

## 2014-11-17 LAB — HEMOGLOBIN A1C: Hgb A1c MFr Bld: 8.1 % — ABNORMAL HIGH (ref 4.0–6.0)

## 2014-11-17 NOTE — Consult Note (Signed)
GI Inpatient Consult Note  Reason for Consult: N/V/D   Attending Requesting Consult:  History of Present Illness: Mario Lewis is a 49 y.o. male with N/V/D and abdominal pain 1 day after epidural injection to back. N/V/abdominal pain has pretty much resolved. Diarrhea persists. Stool studies pending except for neg c.diff. 30 yr hx of taking 1-2 indocin daily for gout. Recalls having normal EGD/colonoscopy about 6 years ago at [facility]. CT suggests possible recent perforation from ulcer. Seen by surgery. Tolerating liquid diet.  Past Medical History:  Past Medical History  Diagnosis Date  . Renal disorder     kidney stones  . Diabetes mellitus without complication (HCC)   . Hypertension   . Gout   . Chronic lower back pain   . CKD (chronic kidney disease)   . Diabetic neuropathy (HCC)     Problem List: Patient Active Problem List   Diagnosis Date Noted  . Perforated gastric ulcer (HCC)   . Gastroenteritis 11/16/2014    Past Surgical History: Past Surgical History  Procedure Laterality Date  . Cholecystectomy      Allergies: No Known Allergies  Home Medications: Prescriptions prior to admission  Medication Sig Dispense Refill Last Dose  . allopurinol (ZYLOPRIM) 300 MG tablet Take 300-600 mg by mouth See admin instructions. 2 tablets every morning and 1 tablet at bedtime   11/15/2014 at pm  . amLODipine (NORVASC) 10 MG tablet Take 10 mg by mouth daily.   11/15/2014 at am  . aspirin 325 MG tablet Take 325 mg by mouth daily.   11/15/2014 at am  . citalopram (CELEXA) 40 MG tablet Take 40 mg by mouth daily.   11/15/2014 at am  . clonazePAM (KLONOPIN) 2 MG tablet Take 2 mg by mouth at bedtime.   11/15/2014 at pm  . clonazePAM (KLONOPIN) 2 MG tablet Take 1 mg by mouth daily as needed for anxiety.   PRN  . colchicine 0.6 MG tablet Take 0.6 mg by mouth daily as needed (for gout flare-ups).   PRN  . Cyanocobalamin (HM SUPER VITAMIN B12) 2500 MCG CHEW Chew 2,500 mcg by mouth daily.    11/15/2014 at am  . ferrous sulfate 325 (65 FE) MG tablet Take 325 mg by mouth daily.   11/15/2014 at am  . hydroxychloroquine (PLAQUENIL) 200 MG tablet Take 400 mg by mouth daily.   11/15/2014 at am  . indomethacin (INDOCIN) 50 MG capsule Take 50 mg by mouth daily.   11/15/2014 at am  . insulin lispro (HUMALOG) 100 UNIT/ML injection Inject into the skin 3 (three) times daily as needed for high blood sugar (per sliding scale).   PRN  . insulin lispro protamine-lispro (HUMALOG 75/25 MIX) (75-25) 100 UNIT/ML SUSP injection Inject 60-80 Units into the skin See admin instructions. 80 units every morning and 60 units at bedtime   11/15/2014 at pm  . lisinopril (PRINIVIL,ZESTRIL) 40 MG tablet Take 40 mg by mouth daily.   11/15/2014 at am  . metFORMIN (GLUCOPHAGE) 500 MG tablet Take 500 mg by mouth 2 (two) times daily with a meal.   11/15/2014 at pm  . mupirocin ointment (BACTROBAN) 2 % Place 1 application into the nose daily as needed (for MRSA flare-ups).   PRN  . niacin 250 MG tablet Take 250 mg by mouth daily.   11/15/2014 at am  . ranitidine (ZANTAC) 150 MG tablet Take 150 mg by mouth daily.   11/15/2014 at am  . simvastatin (ZOCOR) 20 MG tablet Take 20 mg by  mouth at bedtime.   11/15/2014 at pm  . traMADol (ULTRAM) 50 MG tablet Take 50-100 mg by mouth at bedtime as needed for moderate pain or severe pain.   PRN  . Vitamin D, Cholecalciferol, 400 UNITS CHEW Chew 800 Units by mouth daily.   11/15/2014 at am   Home medication reconciliation was completed with the patient.   Scheduled Inpatient Medications:   . allopurinol  300 mg Oral Daily  . amLODipine  10 mg Oral Daily  . cholecalciferol  800 Units Oral Daily  . citalopram  40 mg Oral Daily  . clonazePAM  2 mg Oral QHS  . famotidine  20 mg Oral Daily  . ferrous sulfate  325 mg Oral Daily  . hydroxychloroquine  400 mg Oral Daily  . insulin aspart  0-15 Units Subcutaneous TID WC  . insulin aspart  0-5 Units Subcutaneous QHS  . insulin  aspart protamine- aspart  40 Units Subcutaneous BID WC  . niacin  250 mg Oral Daily  . pantoprazole (PROTONIX) IV  40 mg Intravenous Q12H  . piperacillin-tazobactam (ZOSYN)  IV  4.5 g Intravenous 3 times per day  . simvastatin  20 mg Oral QHS  . sodium chloride  3 mL Intravenous Q12H  . vitamin B-12  2,500 mcg Oral Daily    Continuous Inpatient Infusions:   . sodium chloride 75 mL/hr at 11/17/14 0652    PRN Inpatient Medications:  acetaminophen **OR** acetaminophen, clonazePAM, colchicine, mupirocin ointment, ondansetron **OR** ondansetron (ZOFRAN) IV, traMADol  Family History: family history includes CAD in his father and mother; Diabetes Mellitus II in his father.  The patient's family history is negative for inflammatory bowel disorders, GI malignancy, or solid organ transplantation.  Social History:   reports that he has never smoked. He does not have any smokeless tobacco history on file. He reports that he does not drink alcohol or use illicit drugs. The patient denies ETOH, tobacco, or drug use.   Review of Systems: Constitutional: Weight is stable.  Eyes: No changes in vision. ENT: No oral lesions, sore throat.  GI: see HPI.  Heme/Lymph: No easy bruising.  CV: No chest pain.  GU: No hematuria.  Integumentary: No rashes.  Neuro: No headaches.  Psych: No depression/anxiety.  Endocrine: No heat/cold intolerance.  Allergic/Immunologic: No urticaria.  Resp: No cough, SOB.  Musculoskeletal: No joint swelling.    Physical Examination: BP 147/84 mmHg  Pulse 102  Temp(Src) 98.6 F (37 C) (Oral)  Resp 16  Ht  (1.854 m)  Wt 124.739 kg (275 lb)  BMI 36.29 kg/m2  SpO2 98% Gen: NAD, alert and oriented x 4 HEENT: PEERLA, EOMI, Neck: supple, no JVD or thyromegaly Chest: CTA bilaterally, no wheezes, crackles, or other adventitious sounds CV: RRR, no m/g/c/r Abd: soft, nontender throughout. No peritioneal signs., ND, +BS in all four quadrants; no HSM, guarding,  ridigity, or rebound tenderness Ext: no edema, well perfused with 2+ pulses, Skin: no rash or lesions noted Lymph: no LAD  Data: Lab Results  Component Value Date   WBC 10.6 11/17/2014   HGB 14.7 11/17/2014   HCT 43.8 11/17/2014   MCV 89.4 11/17/2014   PLT 149* 11/17/2014    Recent Labs Lab 11/16/14 1010 11/17/14 0738  HGB 17.0 14.7   Lab Results  Component Value Date   NA 135 11/17/2014   K 4.9 11/17/2014   CL 108 11/17/2014   CO2 22 11/17/2014   BUN 44* 11/17/2014   CREATININE 1.55* 11/17/2014  Lab Results  Component Value Date   ALT 18 11/16/2014   AST 16 11/16/2014   ALKPHOS 111 11/16/2014   BILITOT 0.3 11/16/2014   No results for input(s): APTT, INR, PTT in the last 168 hours. Assessment/Plan: Mario Lewis is a 49 y.o. male with probable gastroenteritis. Air around stomach is probably unrelated to his current GI symptoms.  Recommendations: Agree with holding indocin. Agree with protonix. Agree with stool studies. If neg, then ok to give imodium prn for diarrhea. Can gradually advance as tolerated. In light of possible recent microperforation, would not recommend EGD at this time until later.  Thank you for the consult. Please call with questions or concerns.  Velvet Moomaw, Ezzard StandingPAUL Y, MD

## 2014-11-17 NOTE — Progress Notes (Signed)
[facility] - [place] at [facility]   PATIENT NAME: Mario SailsMatthew Lewis    MR#:  [id number]  DATE OF BIRTH:  04/17/1965  SUBJECTIVE: 49 year old male patient with nausea, vomiting admitted for microperforation. Patient main complaint today is diarrhea had 2 episodes of loose stools.   CHIEF COMPLAINT:   Chief Complaint  Patient presents with  . Nausea  . Abdominal Pain  . Back Pain    REVIEW OF SYSTEMS:   ROS CONSTITUTIONAL: No fever, fatigue or weakness.  EYES: No blurred or double vision.  EARS, NOSE, AND THROAT: No tinnitus or ear pain.  RESPIRATORY: No cough, shortness of breath, wheezing or hemoptysis.  CARDIOVASCULAR: No chest pain, orthopnea, edema.  GASTROINTESTINAL: No nausea, vomiting, diarrhea or abdominal pain.  GENITOURINARY: No dysuria, hematuria.  ENDOCRINE: No polyuria, nocturia,  HEMATOLOGY: No anemia, easy bruising or bleeding SKIN: No rash or lesion. MUSCULOSKELETAL: No joint pain or arthritis.   NEUROLOGIC: No tingling, numbness, weakness.  PSYCHIATRY: No anxiety or depression.   DRUG ALLERGIES:  No Known Allergies  VITALS:  Blood pressure 147/84, pulse 102, temperature 98.6 F (37 C), temperature source Oral, resp. rate 16, height 6\' 1"  (1.854 m), weight 124.739 kg (275 lb), SpO2 98 %.  PHYSICAL EXAMINATION:  GENERAL:  49 y.o.-year-old patient lying in the bed with no acute distress.  EYES: Pupils equal, round, reactive to light and accommodation. No scleral icterus. Extraocular muscles intact.  HEENT: Head atraumatic, normocephalic. Oropharynx and nasopharynx clear.  NECK:  Supple, no jugular venous distention. No thyroid enlargement, no tenderness.  LUNGS: Normal breath sounds bilaterally, no wheezing, rales,rhonchi or crepitation. No use of accessory muscles of respiration.  CARDIOVASCULAR: S1, S2 normal. No murmurs, rubs, or gallops.  ABDOMEN: Soft, nontender, nondistended. Bowel sounds present. No organomegaly or mass.   EXTREMITIES: No pedal edema, cyanosis, or clubbing.  NEUROLOGIC: Cranial nerves II through XII are intact. Muscle strength 5/5 in all extremities. Sensation intact. Gait not checked.  PSYCHIATRIC: The patient is alert and oriented x 3.  SKIN: No obvious rash, lesion, or ulcer.    LABORATORY PANEL:   CBC  Recent Labs Lab 11/17/14 0738  WBC 10.6  HGB 14.7  HCT 43.8  PLT 149*   ------------------------------------------------------------------------------------------------------------------  Chemistries   Recent Labs Lab 11/16/14 1010 11/17/14 0738  NA 132* 135  K 4.1 4.9  CL 101 108  CO2 20* 22  GLUCOSE 314* 211*  BUN 66* 44*  CREATININE 2.06* 1.55*  CALCIUM 9.0 7.9*  AST 16  --   ALT 18  --   ALKPHOS 111  --   BILITOT 0.3  --    ------------------------------------------------------------------------------------------------------------------  Cardiac Enzymes No results for input(s): TROPONINI in the last 168 hours. ------------------------------------------------------------------------------------------------------------------  RADIOLOGY:  Ct Renal Stone Study  11/16/2014  CLINICAL DATA:  Nausea and abdominal pain for 2 days EXAM: CT ABDOMEN AND PELVIS WITHOUT CONTRAST TECHNIQUE: Multidetector CT imaging of the abdomen and pelvis was performed following the standard protocol without IV contrast. COMPARISON:  None. FINDINGS: Lung bases are free of acute infiltrate or sizable effusion. Gallbladder has been surgically removed. The liver, spleen, pancreas and adrenal glands are within normal limits. The kidneys are well visualized bilaterally. No renal calculi or obstructive changes are seen. The ureters are within normal limits bilaterally. The bladder is decompressed. Fluid is noted throughout the colon without obstructive change. The appendix is within normal limits. No significant diverticular change is noted. No acute bony abnormality is seen. Stomach is  decompressed. A  sh feet use small foci of extraluminal air are noted surrounding the stomach. No focal defect is noted although the possibility of a small perforation from the stomach would deserve consideration. IMPRESSION: Tiny foci of extraluminal air surrounding the stomach. These changes are suggestive of a small gastric perforation. Further workup is recommended. No other focal abnormality is noted. Electronically Signed   By: Alcide Clever M.D.   On: 11/16/2014 13:25    EKG:  No orders found for this or any previous visit.  ASSESSMENT AND PLAN:   #1 nausea vomiting secondary to small gastric perforation: Patient has no abdominal pain today. No nausea, no vomiting. Seen by surgery, GI. Patient does not need any surgical intervention, EGD. He is improving with conservative management. I will advance his diet to solid diet and see how he tolerates. #2 diarrhea stool for C. difficile is negative. We can try Imodium. #3. Diabetes mellitus type 2: Use sliding scale with coverage., NovoLog mix and 70/30 40 units twice a day. t #4 chronic gout: Stable. History of chronic kidney disease stage III stable. #4 acute renal failure due to ATN from nausea and vomiting: Renal function is improving. #5 gastric microperforation with leukocytosis: Patient WBC improved from 20-10.6. Continue IV antibiotics for today. Likely discharge tomorrow.    All the records are reviewed and case discussed with Care Management/Social Workerr. Management plans discussed with the patient, family and they are in agreement.  CODE STATUS: full  TOTAL TIME TAKING CARE OF THIS PATIENT:35 minutes.   POSSIBLE D/C IN1-2 DAYS, DEPENDING ON CLINICAL CONDITION.   Katha Hamming M.D on 11/17/2014 at 11:33 AM  Between 7am to 6pm - Pager - [phone]  After 6pm go to [email or web address] - password [email or web address]  [facility]  Office  [phone]  CC: Primary care physician; Rolm Gala, MD   Note:  This dictation was prepared with Dragon dictation along with smaller phrase technology. Any transcriptional errors that result from this process are unintentional.

## 2014-11-17 NOTE — Progress Notes (Signed)
Surgery Progress Note  S: No abd pain, + diarrhea, no nausea/vomiting O:Blood pressure 147/84, pulse 102, temperature 98.6 F (37 C), temperature source Oral, resp. rate 16, height 6\' 1"  (1.854 m), weight 275 lb (124.739 kg), SpO2 98 %. GEN: NAD/A&Ox3 ABD: soft, nontender, nondistended  Labs: WBC 10.6  A/P 49 yo M admit with N/V/D, leukocytosis, small locules of intraabdominal air - cont abx - will likely need outpatient EGD - no surgical intervention at this time

## 2014-11-18 ENCOUNTER — Encounter: Payer: Self-pay | Admitting: Gastroenterology

## 2014-11-18 ENCOUNTER — Inpatient Hospital Stay: Payer: No Typology Code available for payment source

## 2014-11-18 LAB — WBCS, STOOL: WBCS, STOOL: NONE SEEN

## 2014-11-18 LAB — GLUCOSE, CAPILLARY
Glucose-Capillary: 128 mg/dL — ABNORMAL HIGH (ref 65–99)
Glucose-Capillary: 155 mg/dL — ABNORMAL HIGH (ref 65–99)
Glucose-Capillary: 124 mg/dL — ABNORMAL HIGH (ref 65–99)

## 2014-11-18 MED ORDER — LEVOFLOXACIN 500 MG PO TABS: 500.0000 mg | ORAL_TABLET | Freq: Every day | ORAL | Status: DC

## 2014-11-18 MED ORDER — ONDANSETRON 4 MG PO TBDP: 4.0000 mg | ORAL_TABLET | Freq: Three times a day (TID) | ORAL | Status: AC | PRN

## 2014-11-18 MED ORDER — LEVOFLOXACIN 500 MG PO TABS: 500.0000 mg | ORAL_TABLET | Freq: Every day | ORAL | Status: AC

## 2014-11-18 MED ORDER — LOPERAMIDE HCL 2 MG PO CAPS: 2.0000 mg | ORAL_CAPSULE | Freq: Four times a day (QID) | ORAL | Status: DC | PRN

## 2014-11-18 NOTE — Progress Notes (Signed)
Pt d/c to home today. IV removed intact.  Rx's given to pt w/all questions and concerns addressed.  D/C paperwork reviewed and education provided with all questions and concerns addressed.  Pt wife at bedside for home transport.  

## 2014-11-18 NOTE — Consult Note (Signed)
  GI Inpatient Follow-up Note  Patient Identification: Carmon SailsMatthew Wronski is a 49 y.o. male  Subjective: Tolerating solids. Mild nausea. No abdominal pain. No BM in since admission. C.diff neg.  Scheduled Inpatient Medications:  . allopurinol  300 mg Oral Daily  . amLODipine  10 mg Oral Daily  . cholecalciferol  800 Units Oral Daily  . citalopram  40 mg Oral Daily  . clonazePAM  2 mg Oral QHS  . famotidine  20 mg Oral Daily  . ferrous sulfate  325 mg Oral Daily  . hydroxychloroquine  400 mg Oral Daily  . insulin aspart  0-15 Units Subcutaneous TID WC  . insulin aspart  0-5 Units Subcutaneous QHS  . insulin aspart protamine- aspart  40 Units Subcutaneous BID WC  . niacin  250 mg Oral Daily  . pantoprazole (PROTONIX) IV  40 mg Intravenous Q12H  . piperacillin-tazobactam (ZOSYN)  IV  4.5 g Intravenous 3 times per day  . simvastatin  20 mg Oral QHS  . sodium chloride  3 mL Intravenous Q12H  . vitamin B-12  2,500 mcg Oral Daily    Continuous Inpatient Infusions:   . sodium chloride 75 mL/hr at 11/17/14 2002    PRN Inpatient Medications:  acetaminophen **OR** acetaminophen, clonazePAM, colchicine, loperamide, mupirocin ointment, ondansetron **OR** ondansetron (ZOFRAN) IV, traMADol  Review of Systems: Constitutional: Weight is stable.  Eyes: No changes in vision. ENT: No oral lesions, sore throat.  GI: see HPI.  Heme/Lymph: No easy bruising.  CV: No chest pain.  GU: No hematuria.  Integumentary: No rashes.  Neuro: No headaches.  Psych: No depression/anxiety.  Endocrine: No heat/cold intolerance.  Allergic/Immunologic: No urticaria.  Resp: No cough, SOB.  Musculoskeletal: No joint swelling.    Physical Examination: BP 170/80 mmHg  Pulse 92  Temp(Src) 97.5 F (36.4 C) (Oral)  Resp 16  Ht 6\' 1"  (1.854 m)  Wt 124.739 kg (275 lb)  BMI 36.29 kg/m2  SpO2 98% Gen: NAD, alert and oriented x 4 HEENT: PEERLA, EOMI, Neck: supple, no JVD or thyromegaly Chest: CTA bilaterally,  no wheezes, crackles, or other adventitious sounds CV: RRR, no m/g/c/r Abd: soft, NT, ND, +BS in all four quadrants; no HSM, guarding, ridigity, or rebound tenderness Ext: no edema, well perfused with 2+ pulses, Skin: no rash or lesions noted Lymph: no LAD  Data: Lab Results  Component Value Date   WBC 10.6 11/17/2014   HGB 14.7 11/17/2014   HCT 43.8 11/17/2014   MCV 89.4 11/17/2014   PLT 149* 11/17/2014    Recent Labs Lab 11/16/14 1010 11/17/14 0738  HGB 17.0 14.7   Lab Results  Component Value Date   NA 135 11/17/2014   K 4.9 11/17/2014   CL 108 11/17/2014   CO2 22 11/17/2014   BUN 44* 11/17/2014   CREATININE 1.55* 11/17/2014   Lab Results  Component Value Date   ALT 18 11/16/2014   AST 16 11/16/2014   ALKPHOS 111 11/16/2014   BILITOT 0.3 11/16/2014   No results for input(s): APTT, INR, PTT in the last 168 hours. Assessment/Plan: Mr. Eulis FosterCzagas is a 49 y.o. male with gastroenteritis with possible microperforation from ulcer disease.  Recommendations: Ok for discharge from GI point of view. Continue to avoid indocin. Imodium prn if diarrhea recurs. Daily PPI. [place] [facility] [employer] [place], recommend EGD in a month with [facility] there. Will sign off. thanks Please call with questions or concerns.  Oluwatosin Higginson, Ezzard StandingPAUL Y, MD

## 2014-11-18 NOTE — Care Management Note (Signed)
Case Management Note  Patient Details  Name: Mario Lewis MRN: [id number] Date of Birth: 08/20/1965  Subjective/Objective:      No home health orders. Discharged home with PO antibiotic.               Action/Plan:   Expected Discharge Date:                  Expected Discharge Plan:     In-House Referral:     Discharge planning Services     Post Acute Care Choice:    Choice offered to:     DME Arranged:    DME Agency:     HH Arranged:    HH Agency:     Status of Service:     Medicare Important Message Given:    Date Medicare IM Given:    Medicare IM give by:    Date Additional Medicare IM Given:    Additional Medicare Important Message give by:     If discussed at Long Length of Stay Meetings, dates discussed:    Additional Comments:  Ceylon Arenson A, RN 11/18/2014, 1:48 PM

## 2014-11-18 NOTE — Discharge Instructions (Signed)
Notify your doctor if your symptoms persist or worsen.  Take all medications as prescribed and continue the antibiotics until complete even if you are feeling better.  Drink plenty of fluids.  Be sure to follow up with Gastroenterology. Notify your doctor with any questions or concerns.

## 2014-11-18 NOTE — Discharge Summary (Signed)
Mario Lewis, is a 49 y.o. male  DOB [birth year]  MRN [id number].  Admission date:  11/16/2014  Admitting Physician  Enid Baas, MD  Discharge Date:  11/18/2014   Primary MD  Rolm Gala, MD  Recommendations for primary care physician for things to follow:   Follow-up with the gastroenterologist [employer] for EGD in 1 month.   Admission Diagnosis  Flank pain [R10.9] Perforated gastric ulcer, acute (HCC) [K25.1]   Discharge Diagnosis  Flank pain [R10.9] Perforated gastric ulcer, acute (HCC) [K25.1]    Active Problems:   Gastroenteritis   Perforated gastric ulcer (HCC)      Past Medical History  Diagnosis Date  . Renal disorder     kidney stones  . Diabetes mellitus without complication (HCC)   . Hypertension   . Gout   . Chronic lower back pain   . CKD (chronic kidney disease)   . Diabetic neuropathy [facility])     Past Surgical History  Procedure Laterality Date  . Cholecystectomy         History of present illness and  Hospital Course:     Kindly see H&P for history of present illness and admission details, please review complete Labs, Consult reports and Test reports for all details in brief  HPI  from the history and physical done on the day of admission  -year-old male patient with insulin-dependent diabetes mellitus, chronic kidney disease stage III, chronic gout on Indocin comes in because of acute onset of nausea, vomiting, diarrhea. Patient recently received epidural steroid injection for the back pain and also steroids.amended to hospitalist service secondary to acute on chronic renal failure, acute gastroenteritis, gastric microperforation. Hospital Course   #1.perforated peptic ulcer: Noted to have small gastric perforation: Patient never had abdominal pain. Patient was kept nothing by  mouth, started on IV hydration. Patient was seen by surgery and also gastroenterology. We have discontinued his Indocin as an NSAID for associated gastric ulcers. Patient continued on PPIs. Because patient felt better and did not have further nausea and vomiting started on the diet. Patient tolerated the diet well. Repeated abdominal x-ray today showed no free air in peritoneum.advised to follow up with gastroenterologist [facility] at [facility]  for scheduling outpatient EGD.advisedto continue to avoid Indocin..patient was given IV Zosynbecause of elevated white count up to 20. WBCs normalized. Discharge home with Levaquin. #2 diabetes mellitus type 2;take home medications. #3 diarrhea patient's stool has been negative for C. Difficile. Can take Imodium as needed. #4 chronic gout;continue colchicine, allopurinol.discontinue Indocin. Advised to follow-up with primary doctor regarding alternative medication. 5 acute renal failure on CK D stage  3: ATN secondary to nausea, vomiting: Patient renal function improved.  Discharge Condition: stable   Follow UP      Discharge Instructions  and  Discharge Medications        Medication List    STOP taking these medications        indomethacin 50 MG capsule  Commonly known as:  INDOCIN      TAKE these medications        allopurinol 300 MG tablet  Commonly known as:  ZYLOPRIM  Take 300-600 mg by mouth See admin instructions. 2 tablets every morning and 1 tablet at bedtime     amLODipine 10 MG tablet  Commonly known as:  NORVASC  Take 10 mg by mouth daily.     aspirin 325 MG tablet  Take 325 mg by mouth daily.     citalopram 40 MG  tablet  Commonly known as:  CELEXA  Take 40 mg by mouth daily.     clonazePAM 2 MG tablet  Commonly known as:  KLONOPIN  Take 2 mg by mouth at bedtime.     clonazePAM 2 MG tablet  Commonly known as:  KLONOPIN  Take 1 mg by mouth daily as needed for anxiety.     colchicine 0.6 MG tablet  Take 0.6 mg by  mouth daily as needed (for gout flare-ups).     ferrous sulfate 325 (65 FE) MG tablet  Take 325 mg by mouth daily.     HM SUPER VITAMIN B12 2500 MCG Chew  Generic drug:  Cyanocobalamin  Chew 2,500 mcg by mouth daily.     hydroxychloroquine 200 MG tablet  Commonly known as:  PLAQUENIL  Take 400 mg by mouth daily.     insulin lispro 100 UNIT/ML injection  Commonly known as:  HUMALOG  Inject into the skin 3 (three) times daily as needed for high blood sugar (per sliding scale).     insulin lispro protamine-lispro (75-25) 100 UNIT/ML Susp injection  Commonly known as:  HUMALOG 75/25 MIX  Inject 60-80 Units into the skin See admin instructions. 80 units every morning and 60 units at bedtime     levofloxacin 500 MG tablet  Commonly known as:  LEVAQUIN  Take 1 tablet (500 mg total) by mouth daily.     lisinopril 40 MG tablet  Commonly known as:  PRINIVIL,ZESTRIL  Take 40 mg by mouth daily.     metFORMIN 500 MG tablet  Commonly known as:  GLUCOPHAGE  Take 500 mg by mouth 2 (two) times daily with a meal.     mupirocin ointment 2 %  Commonly known as:  BACTROBAN  Place 1 application into the nose daily as needed (for MRSA flare-ups).     niacin 250 MG tablet  Take 250 mg by mouth daily.     ranitidine 150 MG tablet  Commonly known as:  ZANTAC  Take 150 mg by mouth daily.     simvastatin 20 MG tablet  Commonly known as:  ZOCOR  Take 20 mg by mouth at bedtime.     traMADol 50 MG tablet  Commonly known as:  ULTRAM  Take 50-100 mg by mouth at bedtime as needed for moderate pain or severe pain.     Vitamin D (Cholecalciferol) 400 UNITS Chew  Chew 800 Units by mouth daily.          Diet and Activity recommendation: See Discharge Instructions above   Consults obtained - GI, surgery   Major procedures and Radiology Reports - PLEASE review detailed and final reports for all details, in brief -      Dg Abd 1 View  11/18/2014  CLINICAL DATA:  Status postop poles  scratch the status post hospitalization since 11/16/2014 for abdominal pain and nausea. Initial encounter. EXAM: ABDOMEN - 1 VIEW COMPARISON:  CT abdomen and pelvis 11/16/2014. FINDINGS: Tiny foci of free intraperitoneal air seen on the prior CT scan are not visible on this examination. The bowel gas pattern is normal. No focal bony abnormality is identified. IMPRESSION: Tiny amount of free air seen on comparison CT scan is not visible on this examination. This study is negative. Electronically Signed   By: Drusilla Kanner M.D.   On: 11/18/2014 12:05   Ct Renal Stone Study  11/16/2014  CLINICAL DATA:  Nausea and abdominal pain for 2 days EXAM: CT ABDOMEN AND PELVIS WITHOUT  CONTRAST TECHNIQUE: Multidetector CT imaging of the abdomen and pelvis was performed following the standard protocol without IV contrast. COMPARISON:  None. FINDINGS: Lung bases are free of acute infiltrate or sizable effusion. Gallbladder has been surgically removed. The liver, spleen, pancreas and adrenal glands are within normal limits. The kidneys are well visualized bilaterally. No renal calculi or obstructive changes are seen. The ureters are within normal limits bilaterally. The bladder is decompressed. Fluid is noted throughout the colon without obstructive change. The appendix is within normal limits. No significant diverticular change is noted. No acute bony abnormality is seen. Stomach is decompressed. A sh feet use small foci of extraluminal air are noted surrounding the stomach. No focal defect is noted although the possibility of a small perforation from the stomach would deserve consideration. IMPRESSION: Tiny foci of extraluminal air surrounding the stomach. These changes are suggestive of a small gastric perforation. Further workup is recommended. No other focal abnormality is noted. Electronically Signed   By: Alcide CleverMark  Lukens M.D.   On: 11/16/2014 13:25    Micro Results    Recent Results (from the past 240 hour(s))   Blood culture (routine x 2)     Status: None (Preliminary result)   Collection Time: 11/16/14  1:53 PM  Result Value Ref Range Status   Specimen Description BLOOD LEFT WRIST  Final   Special Requests BOTTLES DRAWN AEROBIC AND ANAEROBIC 2CC  Final   Culture NO GROWTH 2 DAYS  Final   Report Status PENDING  Incomplete  Blood culture (routine x 2)     Status: None (Preliminary result)   Collection Time: 11/16/14  1:57 PM  Result Value Ref Range Status   Specimen Description BLOOD LEFT FOREARM  Final   Special Requests BOTTLES DRAWN AEROBIC AND ANAEROBIC  1CC  Final   Culture NO GROWTH 2 DAYS  Final   Report Status PENDING  Incomplete  C difficile quick scan w PCR reflex     Status: None   Collection Time: 11/16/14  8:57 PM  Result Value Ref Range Status   C Diff antigen NEGATIVE NEGATIVE Final   C Diff toxin NEGATIVE NEGATIVE Final   C Diff interpretation Negative for C. difficile  Final       Today   Subjective:   Mario SailsMatthew Lewis today has no headache,no chest abdominal pain,no new weakness tingling or numbness, feels much better wants to go home today.   Objective:   Blood pressure 170/80, pulse 92, temperature 97.5 F (36.4 C), temperature source Oral, resp. rate 16, height 6\' 1"  (1.854 m), weight 124.739 kg (275 lb), SpO2 98 %.   Intake/Output Summary (Last 24 hours) at 11/18/14 1240 Last data filed at 11/18/14 0900  Gross per 24 hour  Intake   2082 ml  Output   3175 ml  Net  -1093 ml    Exam Awake Alert, Oriented x 3, No new F.N deficits, Normal affect [place].AT,PERRAL Supple Neck,No JVD, No cervical lymphadenopathy appriciated.  Symmetrical Chest wall movement, Good air movement bilaterally, CTAB RRR,No Gallops,Rubs or new Murmurs, No Parasternal Heave +ve B.Sounds, Abd Soft, Non tender, No organomegaly appriciated, No rebound -guarding or rigidity. No Cyanosis, Clubbing or edema, No new Rash or bruise  Data Review   CBC w Diff: Lab Results  Component Value  Date   WBC 10.6 11/17/2014   HGB 14.7 11/17/2014   HCT 43.8 11/17/2014   PLT 149* 11/17/2014   LYMPHOPCT 13 11/16/2014   MONOPCT 8 11/16/2014   EOSPCT 1  11/16/2014   BASOPCT 1 11/16/2014    CMP: Lab Results  Component Value Date   NA 135 11/17/2014   K 4.9 11/17/2014   CL 108 11/17/2014   CO2 22 11/17/2014   BUN 44* 11/17/2014   CREATININE 1.55* 11/17/2014   PROT 8.4* 11/16/2014   ALBUMIN 4.4 11/16/2014   BILITOT 0.3 11/16/2014   ALKPHOS 111 11/16/2014   AST 16 11/16/2014   ALT 18 11/16/2014  .   Total Time in preparing paper work, data evaluation and todays exam - 35 minutes  Ion Gonnella M.D on 11/18/2014 at 12:40 PM    Note: This dictation was prepared with Dragon dictation along with smaller phrase technology. Any transcriptional errors that result from this process are unintentional.

## 2014-11-21 LAB — STOOL CULTURE

## 2014-11-21 LAB — CULTURE, BLOOD (ROUTINE X 2)
CULTURE: NO GROWTH
Culture: NO GROWTH

## 2016-05-12 IMAGING — CT CT RENAL STONE PROTOCOL
1 of 3 series · 14 of 32 positions shown, 19 images · non-contrast
Comparison: None.

CLINICAL DATA: Nausea and abdominal pain for 2 days

EXAM:
CT ABDOMEN AND PELVIS WITHOUT CONTRAST
TECHNIQUE: Multidetector CT imaging of the abdomen and pelvis was performed
following the standard protocol without IV contrast.

[Series 4: stone standard full · axial · 0.90mm/px · z∈[-1019,-594]mm · 14 of 96 slices shown, 19 images]
[im 6/96  soft-tissue]
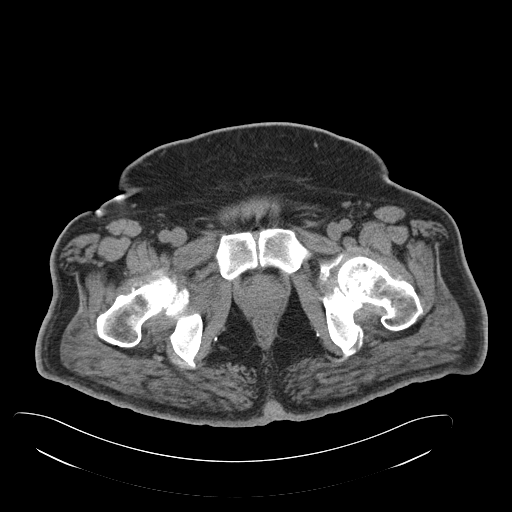
[im 6/96  bone]
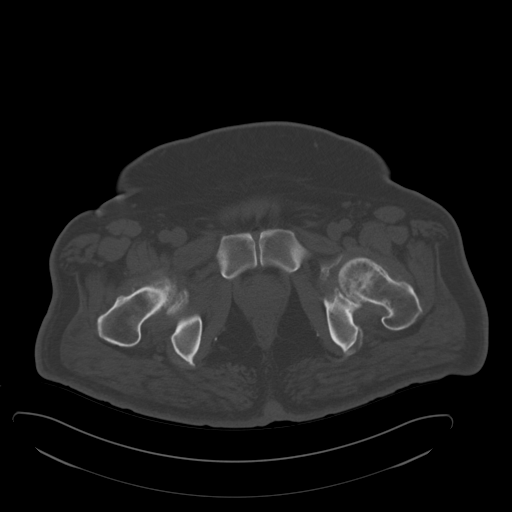
[im 16/96  soft-tissue]
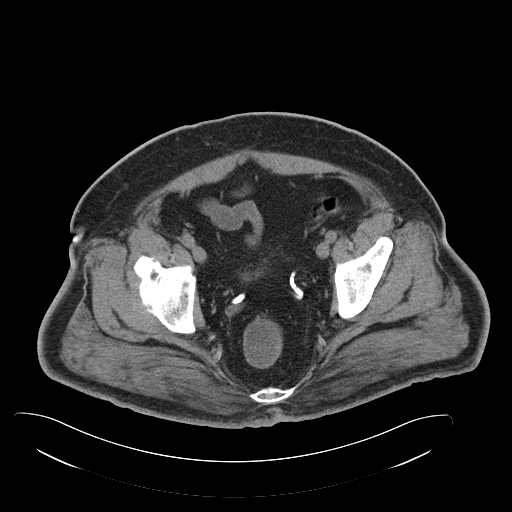
[im 21/96  soft-tissue]
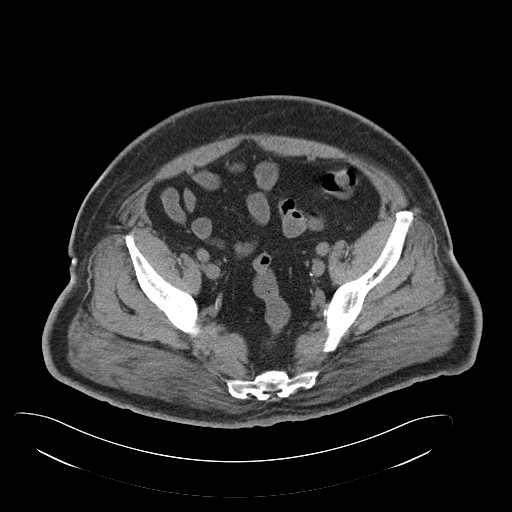
[im 26/96  soft-tissue]
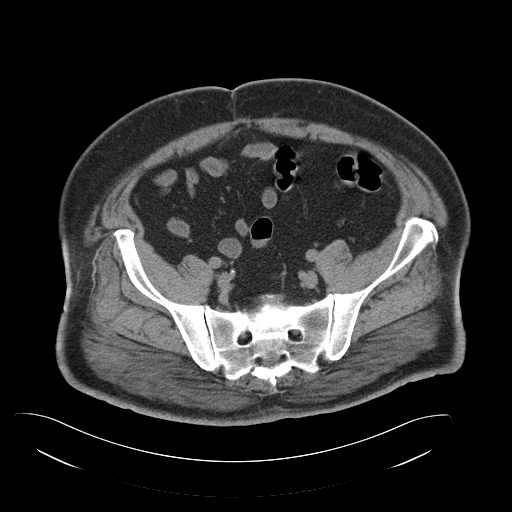
[im 36/96  soft-tissue]
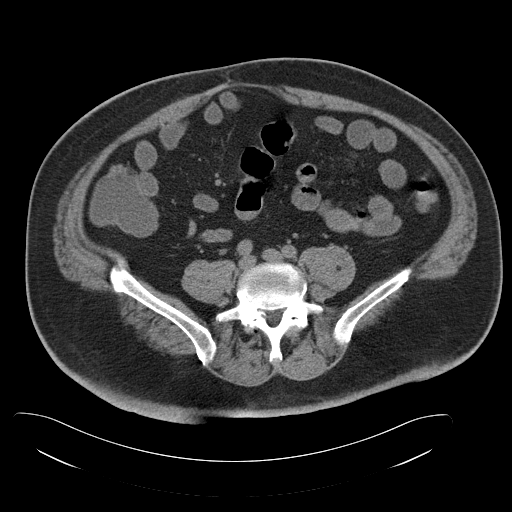
[im 41/96  soft-tissue]
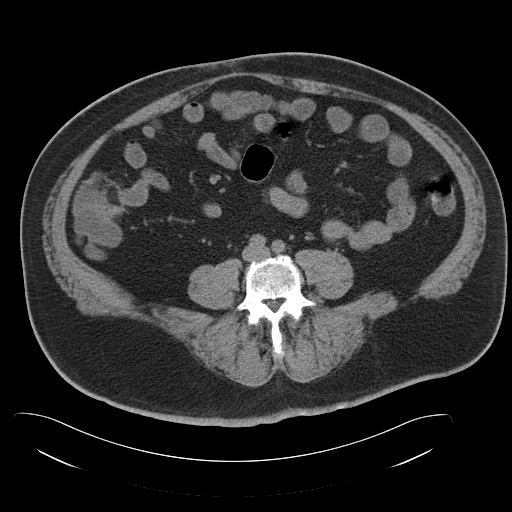
[im 51/96  soft-tissue]
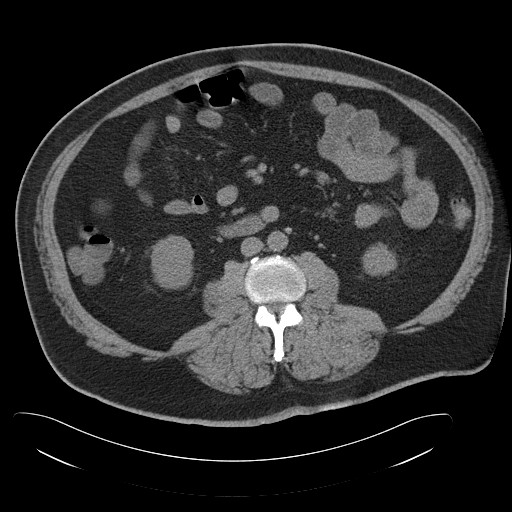
[im 56/96  soft-tissue]
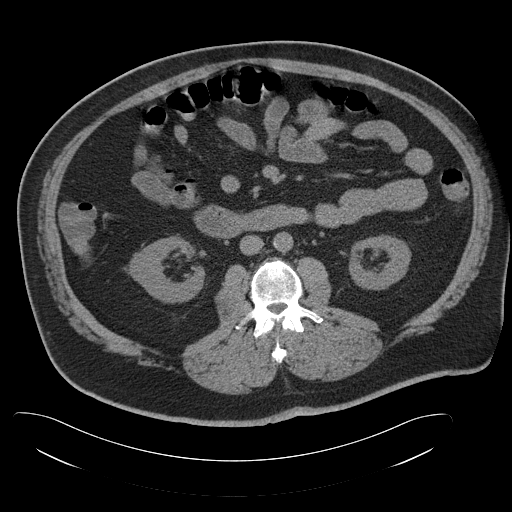
[im 61/96  soft-tissue]
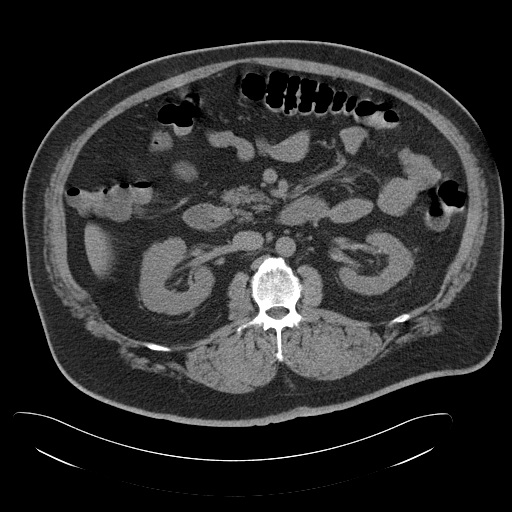
[im 61/96  bone]
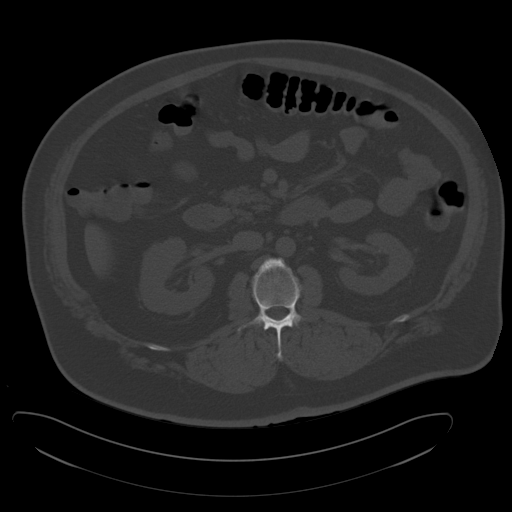
[im 71/96  soft-tissue]
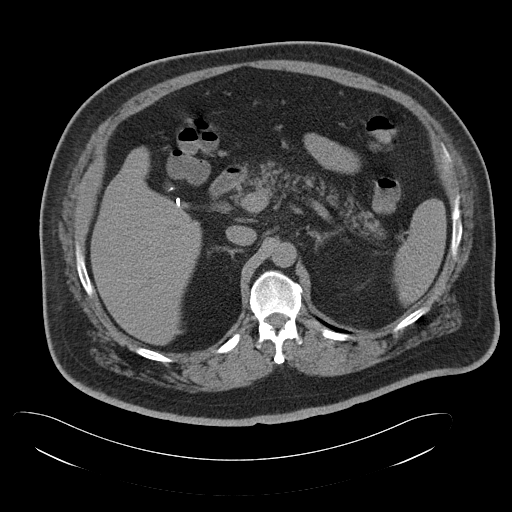
[im 76/96  soft-tissue]
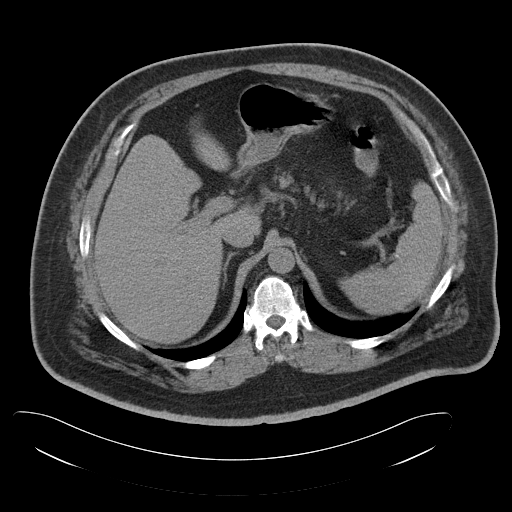
[im 76/96  lung]
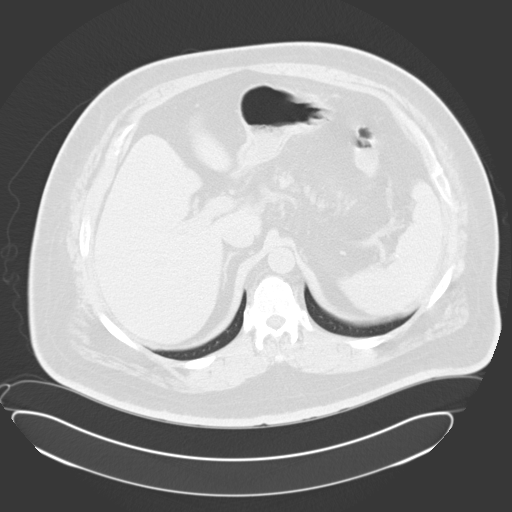
[im 81/96  soft-tissue]
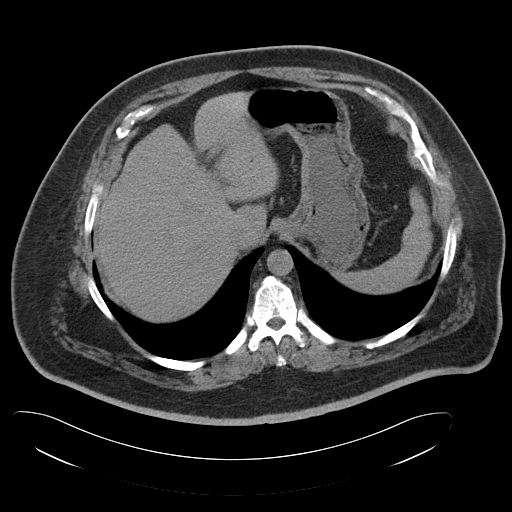
[im 81/96  lung]
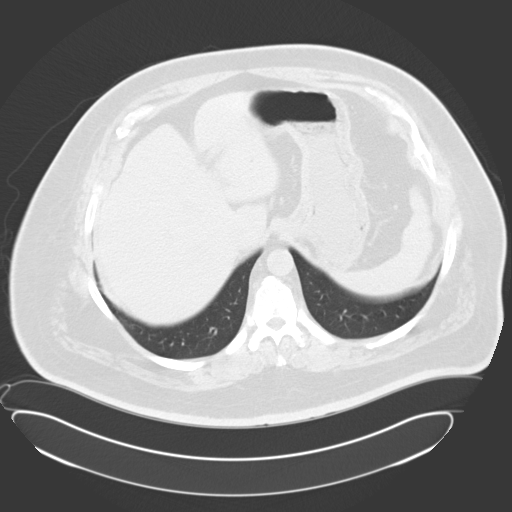
[im 86/96  lung]
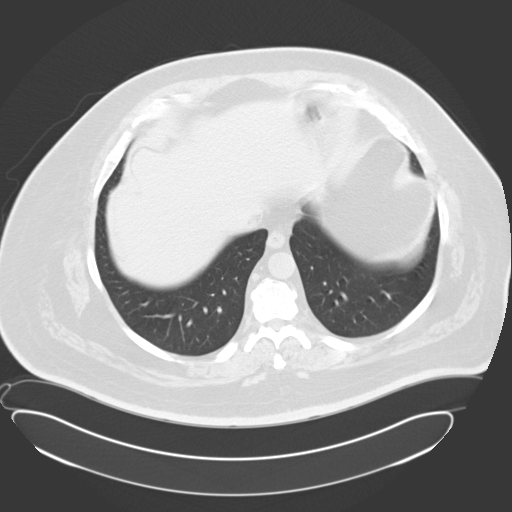
[im 91/96  soft-tissue]
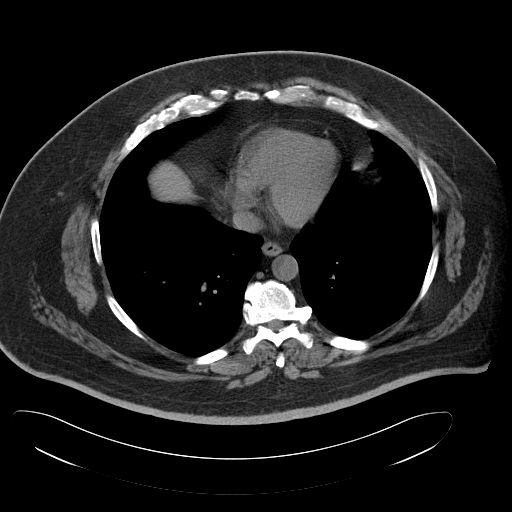
[im 91/96  lung]
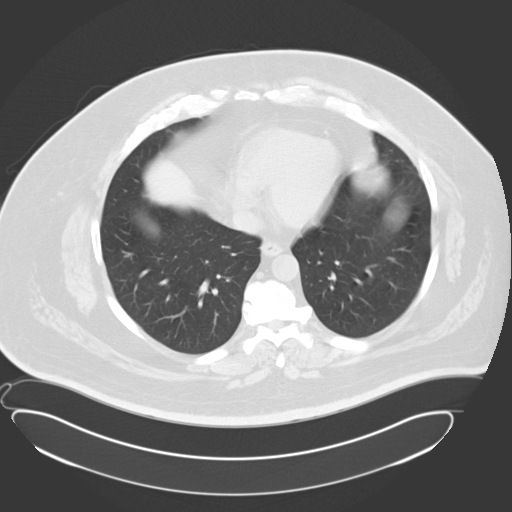

[14 of 32 positions shown; findings below may reference images not displayed]

FINDINGS: Lung bases are free of acute infiltrate or sizable effusion.

Gallbladder has been surgically removed. The liver, spleen, pancreas
and adrenal glands are within normal limits. The kidneys are well
visualized bilaterally. No renal calculi or obstructive changes are
seen. The ureters are within normal limits bilaterally. The bladder
is decompressed.

Fluid is noted throughout the colon without obstructive change. The
appendix is within normal limits. No significant diverticular change
is noted. No acute bony abnormality is seen. Stomach is
decompressed. A sh feet use small foci of extraluminal air are noted
surrounding the stomach. No focal defect is noted although the
possibility of a small perforation from the stomach would deserve
consideration.
IMPRESSION: Tiny foci of extraluminal air surrounding the stomach. These changes
are suggestive of a small gastric perforation. Further workup is
recommended.

No other focal abnormality is noted.
